# Patient Record
Sex: Male | Born: 1990 | Race: White | Hispanic: No | Marital: Single | Smoking: Current every day smoker
Health system: Southern US, Community
[De-identification: ages and names within clinical notes are randomized; demographics above are authoritative.]

## PROBLEM LIST (undated history)

## (undated) HISTORY — PX: OTHER SURGICAL HISTORY: SHX169

---
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
## 2009-07-15 ENCOUNTER — Emergency Department (HOSPITAL_COMMUNITY): Admission: EM | Admit: 2009-07-15 | Discharge: 2009-07-15 | Payer: Self-pay | Admitting: Emergency Medicine

## 2010-08-27 NOTE — Op Note (Signed)
Ryan Wang, Ryan Wang               ACCOUNT NO.:  [id number]   MEDICAL RECORD NO.:  [id number]          PATIENT TYPE:  REC   LOCATION:  [facility]                         FACILITY:  [facility]   PHYSICIAN:  Matthew A. Weingold, M.D.DATE OF BIRTH:  25-Mar-1991   DATE OF PROCEDURE:  10/26/2006  DATE OF DISCHARGE:                               OPERATIVE REPORT   PREOPERATIVE DIAGNOSIS:  Malunited right thumb metacarpal base fracture.   POSTOPERATIVE DIAGNOSIS:  Malunited right thumb metacarpal base  fracture.   PROCEDURE:  Takedown malunion with open reduction internal fixation  above using 0.045 K-wires x2.   SURGEON:  Artist Pais. Mina Marble, M.D.   ASSISTANT:  None.   ANESTHESIA:  General.   TOURNIQUET TIME:  38 minutes.   COMPLICATIONS:  None.   DRAINS:  None.   OPERATIVE REPORT:  The patient taken to operating suite, after induction  of adequate general anesthesia, right upper extremity prepped draped  usual sterile fashion.  Esmarch was used to exsanguinate limb.  Tourniquet inflated 250 mm. At this point time, gentle and forceful  manipulation was then tried to reduce a fracture of the thumb metacarpal  base with apex dorsal angulation.  After repeated attempts, the fracture  site was then opened longitudinally and dorsally.  The EPB tendons  retracted to the lateral side.  A subperiosteal dissection was  undertaken.  The fracture site was exposed.  A Freer elevator was used  to gently enter the fracture site which was taken down and debrided of  callus until reduction was performed.  It was then fixed with two 0.045  K-wires that were driven from the inside of the intramedullary canal  proximally out distally through the skin.  The fracture was then reduced  and then they are driven from distal to proximal across the fracture  site.  Intraoperative fluoroscopy revealed near anatomic reduction both  the AP lateral and oblique view.  K-wires were cut outside the skin and  bent upon  themselves.  Periosteum was closed with 4-0 Vicryl and the  skin with 4-0 nylon.  Xeroform, 4x4s fluffs and regular splint was  applied.  The patient tolerated well went to recovery stable fashion.      Artist Pais Mina Marble, M.D.  Electronically Signed     MAW/MEDQ  D:  10/26/2006  T:  10/27/2006  Job:  [id number]

## 2011-01-28 LAB — CBC
HCT: 49.6 — ABNORMAL HIGH
Hemoglobin: 17.1 — ABNORMAL HIGH
MCHC: 34.5
MCV: 90.1
Platelets: 155 — ABNORMAL LOW
RBC: 5.5
RDW: 13.2
WBC: 6.1

## 2012-02-25 ENCOUNTER — Encounter (HOSPITAL_COMMUNITY): Payer: Self-pay | Admitting: Emergency Medicine

## 2012-02-25 ENCOUNTER — Observation Stay (HOSPITAL_COMMUNITY)
Admission: EM | Admit: 2012-02-25 | Discharge: 2012-02-26 | Disposition: A | Discharge status: Home / Self Care | Payer: Self-pay | Attending: Emergency Medicine | Admitting: Emergency Medicine

## 2012-02-25 DIAGNOSIS — K130 Diseases of lips: Principal | ICD-10-CM | POA: Insufficient documentation

## 2012-02-25 DIAGNOSIS — L0390 Cellulitis, unspecified: Secondary | ICD-10-CM

## 2012-02-25 DIAGNOSIS — F17200 Nicotine dependence, unspecified, uncomplicated: Secondary | ICD-10-CM | POA: Insufficient documentation

## 2012-02-25 LAB — BASIC METABOLIC PANEL
BUN: 13 mg/dL (ref 6–23)
Chloride: 98 mEq/L (ref 96–112)
Glucose, Bld: 99 mg/dL (ref 70–99)
Potassium: 4 mEq/L (ref 3.5–5.1)
Sodium: 136 mEq/L (ref 135–145)

## 2012-02-25 LAB — CBC
MCH: 30.9 pg (ref 26.0–34.0)
MCHC: 35.5 g/dL (ref 30.0–36.0)
Platelets: 176 10*3/uL (ref 150–400)

## 2012-02-25 MED ORDER — ONDANSETRON HCL 4 MG/2ML IJ SOLN
4.0000 mg | INTRAMUSCULAR | Status: DC
  Administered 2012-02-25 – 2012-02-26 (×2): 4 mg via INTRAVENOUS
  Filled 2012-02-25: qty 2

## 2012-02-25 MED ORDER — CLINDAMYCIN PHOSPHATE 600 MG/50ML IV SOLN
600.0000 mg | Freq: Three times a day (TID) | INTRAVENOUS | Status: DC
  Administered 2012-02-25 – 2012-02-26 (×2): 600 mg via INTRAVENOUS
  Filled 2012-02-25 (×2): qty 50

## 2012-02-25 MED ORDER — MORPHINE SULFATE 4 MG/ML IJ SOLN
4.0000 mg | INTRAMUSCULAR | Status: DC | PRN
  Administered 2012-02-25 – 2012-02-26 (×3): 4 mg via INTRAVENOUS
  Filled 2012-02-25 (×3): qty 1

## 2012-02-25 MED ORDER — ONDANSETRON HCL 4 MG/2ML IJ SOLN
4.0000 mg | INTRAMUSCULAR | Status: DC
  Filled 2012-02-25: qty 2

## 2012-02-25 MED ORDER — SODIUM CHLORIDE 0.9 % IV SOLN
1000.0000 mL | Freq: Once | INTRAVENOUS | Status: AC
  Administered 2012-02-25: 1000 mL via INTRAVENOUS

## 2012-02-25 MED ORDER — KETOROLAC TROMETHAMINE 60 MG/2ML IM SOLN
60.0000 mg | Freq: Once | INTRAMUSCULAR | Status: AC
  Administered 2012-02-25: 60 mg via INTRAMUSCULAR
  Filled 2012-02-25: qty 2

## 2012-02-25 NOTE — ED Notes (Signed)
Patient reports that he had a pimple that he popped on his top lip yesterday; patient reports increased swelling to upper lip; reports pain in this area.  Patient reports another abscess area to left chest/side that has been there for about a week.

## 2012-02-25 NOTE — ED Provider Notes (Signed)
History     CSN: [id number]  Arrival date & time 02/25/12  2043   First MD Initiated Contact with Patient 02/25/12 2100      Chief Complaint  Patient presents with  . Oral Swelling    (Consider location/radiation/quality/duration/timing/severity/associated sxs/prior treatment) HPI Comments: Ryan Wang 21 y.o. male   The chief complaint is: Patient presents with:   Oral Swelling    Cc/ lip swelling. Popped small pimple on lip, 3 days ago.  Increased Heat, redness and swelling. Similar on left chest. Denies, diffult swallowing or breathing.  + teEth pain.    Denies fevers, chills, myalgias, arthralgias. Denies DOE, SOB, chest tightness or pressure, radiation to left arm, jaw or back, or diaphoresis. Denies dysuria, flank pain, suprapubic pain, frequency, urgency, or hematuria. Denies headaches, light headedness, weakness, visual disturbances. Denies abdominal pain, nausea, vomiting, diarrhea or constipation.      Patient is a 21 y.o. male presenting with abscess. The history is provided by the patient. No language interpreter was used.  Abscess  This is a new problem. The current episode started less than one week ago. The onset was gradual. The problem occurs continuously. The problem has been gradually worsening. The abscess is present on the lips. The problem is severe. The abscess is characterized by redness, peeling and painfulness. The abscess first occurred at home. Associated symptoms include decreased sleep. Pertinent negatives include no anorexia, no decrease in physical activity, not drinking less, no fever, no fussiness, not sleeping more, no congestion and no sore throat.    History reviewed. No pertinent past medical history.  Past Surgical History  Procedure Date  . Thumb surgery     History reviewed. No pertinent family history.  History  Substance Use Topics  . Smoking status: Current Every Day Smoker -- 1.0 packs/day  . Smokeless tobacco: Not  on file  . Alcohol Use: No      Review of Systems  Constitutional: Negative.  Negative for fever.  HENT: Negative for congestion and sore throat.        Lip pain  Eyes: Negative.   Respiratory: Negative.   Cardiovascular: Negative.   Gastrointestinal: Negative.  Negative for anorexia.  Genitourinary: Negative.   Musculoskeletal: Negative.   Neurological: Negative.   Hematological: Negative.   Psychiatric/Behavioral: Negative.   All other systems reviewed and are negative.    Allergies  Review of patient's allergies indicates no known allergies.  Home Medications  No current outpatient prescriptions on file.  BP 160/94  Pulse 75  Temp 99.2 F (37.3 C) (Oral)  Resp 16  SpO2 99%  Physical Exam  Nursing note and vitals reviewed. Constitutional: He appears well-developed and well-nourished. No distress.  HENT:  Head: Normocephalic and atraumatic.         Area of warmth, erythema, left upper lip is indurated.  There is a pustular head at the room the vermilion border.  A small amount of extension superior next of the nasolabial fold.  There is no fluctuance palpable.  No extension into the gingiva or teeth.  Eyes: Conjunctivae normal are normal. No scleral icterus.  Neck: Normal range of motion. Neck supple.  Cardiovascular: Normal rate, regular rhythm and normal heart sounds.   Pulmonary/Chest: Effort normal and breath sounds normal. No respiratory distress.  Abdominal: Soft. There is no tenderness.  Musculoskeletal: He exhibits no edema.  Neurological: He is alert.  Skin: Skin is warm and dry. He is not diaphoretic.  Psychiatric: His behavior is normal.  ED Course  Procedures (including critical care time)  Labs Reviewed - No data to display No results found.   1. Cellulitis and abscess       MDM  11:09 PM BP 160/94  Pulse 75  Temp 99.2 F (37.3 C) (Oral)  Resp 16  SpO2 99%  Patient t with left upper lip abscess and surrounding cellulitis.  I  spoken with Dr. Silverio Lay my attending physician as I do not feel comfortable draining this abscess.  Dr. Silverio Lay has suggested consult with oral maxillofacial facial surgery.  In placing the patient in CDU on cellulitis protocol.  Currently awaiting return call from [facility].    .11:17 PM I spoke with Dr. Barbette Merino who has agreed to see the patient in his office tomorrow.   5:17 AM Patient is doing well. He requests more toradol.  Patient redness appears improved.  He will get a second dose of clindamycin at 6:00 am. Patient may then be discharged if VSS and will f/u with Dr. Barbette Merino today. PO clinda at dc.    Arthor Captain, PA-C 02/26/12 2001

## 2012-02-25 NOTE — ED Notes (Signed)
Patient reports that he was given an antibiotic from his friend that had a staph infection; reports taking antibiotic once today.

## 2012-02-25 NOTE — ED Provider Notes (Signed)
Patient  to the CDU  11:30 PM.  he thinks the swelling in his lip has decreased since he began IV antibiotics.patient reports he is having less pain and is feeling better. Patient reports area began as a pimple yesterday and now has swelling to his upper lip. On physical exam vitals are stable patient is resting comfortably. There is erythema to patient's left upper lip there is a blistered area in the center of patient's mid lip. Plan is for patient to continue IV antibiotics.   Lonia Skinner [place], [place] 02/25/12 2344

## 2012-02-26 MED ORDER — KETOROLAC TROMETHAMINE 30 MG/ML IJ SOLN
30.0000 mg | Freq: Once | INTRAMUSCULAR | Status: AC
  Administered 2012-02-26: 30 mg via INTRAVENOUS
  Filled 2012-02-26: qty 1

## 2012-02-26 MED ORDER — CLINDAMYCIN HCL 150 MG PO CAPS: 300.0000 mg | ORAL_CAPSULE | Freq: Four times a day (QID) | ORAL | Status: DC

## 2012-02-26 NOTE — ED Provider Notes (Signed)
Medical screening examination/treatment/procedure(s) were performed by non-physician practitioner and as supervising physician I was immediately available for consultation/collaboration.   Richardean Canal, MD 02/26/12 [phone]

## 2012-02-26 NOTE — ED Provider Notes (Signed)
Medical screening examination/treatment/procedure(s) were performed by non-physician practitioner and as supervising physician I was immediately available for consultation/collaboration.   Richardean Canal, MD 02/26/12 [phone]

## 2012-02-26 NOTE — ED Notes (Signed)
EDP at bedside  

## 2012-02-26 NOTE — ED Notes (Signed)
Pt denies nausea at this time, states does not need zofran. Zofran held.

## 2013-05-04 ENCOUNTER — Emergency Department (HOSPITAL_COMMUNITY)
Admission: EM | Admit: 2013-05-04 | Discharge: 2013-05-04 | Disposition: A | Discharge status: Home / Self Care | Payer: 59 | Attending: Emergency Medicine | Admitting: Emergency Medicine

## 2013-05-04 ENCOUNTER — Encounter (HOSPITAL_COMMUNITY): Payer: Self-pay | Admitting: Emergency Medicine

## 2013-05-04 ENCOUNTER — Emergency Department (HOSPITAL_COMMUNITY): Payer: 59

## 2013-05-04 DIAGNOSIS — R05 Cough: Secondary | ICD-10-CM | POA: Insufficient documentation

## 2013-05-04 DIAGNOSIS — R042 Hemoptysis: Secondary | ICD-10-CM | POA: Insufficient documentation

## 2013-05-04 DIAGNOSIS — F17200 Nicotine dependence, unspecified, uncomplicated: Secondary | ICD-10-CM | POA: Insufficient documentation

## 2013-05-04 DIAGNOSIS — R0789 Other chest pain: Secondary | ICD-10-CM | POA: Insufficient documentation

## 2013-05-04 DIAGNOSIS — R059 Cough, unspecified: Secondary | ICD-10-CM | POA: Insufficient documentation

## 2013-05-04 LAB — BASIC METABOLIC PANEL
BUN: 15 mg/dL (ref 6–23)
CALCIUM: 9.2 mg/dL (ref 8.4–10.5)
CHLORIDE: 101 meq/L (ref 96–112)
CO2: 23 meq/L (ref 19–32)
Creatinine, Ser: 0.96 mg/dL (ref 0.50–1.35)
GFR calc Af Amer: >90 mL/min (ref >90)
GFR calc non Af Amer: >90 mL/min (ref >90)
GLUCOSE: 96 mg/dL (ref 70–99)
Potassium: 4 mEq/L (ref 3.7–5.3)
Sodium: 140 mEq/L (ref 137–147)

## 2013-05-04 LAB — POCT I-STAT TROPONIN I
TROPONIN I, POC: 0 ng/mL (ref 0.00–0.08)
Troponin i, poc: 0 ng/mL (ref 0.00–0.08)

## 2013-05-04 LAB — CBC
HEMATOCRIT: 49.6 % (ref 39.0–52.0)
HEMOGLOBIN: 17.5 g/dL — AB (ref 13.0–17.0)
MCH: 30.8 pg (ref 26.0–34.0)
MCHC: 35.3 g/dL (ref 30.0–36.0)
MCV: 87.3 fL (ref 78.0–100.0)
Platelets: 208 10*3/uL (ref 150–400)
RBC: 5.68 MIL/uL (ref 4.22–5.81)
RDW: 12.6 % (ref 11.5–15.5)
WBC: 10 10*3/uL (ref 4.0–10.5)

## 2013-05-04 LAB — D-DIMER, QUANTITATIVE (NOT AT ARMC): D DIMER QUANT: 0.34 ug{FEU}/mL (ref 0.00–0.48)

## 2013-05-04 LAB — PRO B NATRIURETIC PEPTIDE: Pro B Natriuretic peptide (BNP): <5 pg/mL (ref 0–125)

## 2013-05-04 MED ORDER — KETOROLAC TROMETHAMINE 30 MG/ML IJ SOLN: 30.0000 mg | Freq: Once | INTRAMUSCULAR | Status: DC

## 2013-05-04 MED ORDER — KETOROLAC TROMETHAMINE 60 MG/2ML IM SOLN
60.0000 mg | Freq: Once | INTRAMUSCULAR | Status: AC
  Administered 2013-05-04: 60 mg via INTRAMUSCULAR
  Filled 2013-05-04: qty 2

## 2013-05-04 NOTE — ED Notes (Signed)
Pt. reports left chest pain for several days with slight SOB worse with deep inspiration , denies nausea or diaphoresis .

## 2013-05-04 NOTE — Discharge Instructions (Signed)
If you were given medicines take as directed.  If you are on coumadin or contraceptives realize their levels and effectiveness is altered by many different medicines.  If you have any reaction (rash, tongues swelling, other) to the medicines stop taking and see a physician.   Please follow up as directed and return to the ER or see a physician for new or worsening symptoms.  Thank you.  Chest Pain (Nonspecific) Chest pain has many causes. Your pain could be caused by something serious, such as a heart attack or a blood clot in the lungs. It could also be caused by something less serious, such as a chest bruise or a virus. Follow up with your doctor. More lab tests or other studies may be needed to find the cause of your pain. Most of the time, nonspecific chest pain will improve within 2 to 3 days of rest and mild pain medicine. HOME CARE  For chest bruises, you may put ice on the sore area for 15-20 minutes, 03-04 times a day. Do this only if it makes you feel better.  Put ice in a plastic bag.  Place a towel between the skin and the bag.  Rest for the next 2 to 3 days.  Go back to work if the pain improves.  See your doctor if the pain lasts longer than 1 to 2 weeks.  Only take medicine as told by your doctor.  Quit smoking if you smoke. GET HELP RIGHT AWAY IF:   There is more pain or pain that spreads to the arm, neck, jaw, back, or belly (abdomen).  You have shortness of breath.  You cough more than usual or cough up blood.  You have very bad back or belly pain, feel sick to your stomach (nauseous), or throw up (vomit).  You have very bad weakness.  You pass out (faint).  You have a fever. Any of these problems may be serious and may be an emergency. Do not wait to see if the problems will go away. Get medical help right away. Call your local emergency services 911 in [place] Do not drive yourself to the hospital. MAKE SURE YOU:   Understand these instructions.  Will watch  this condition.  Will get help right away if you or your child is not doing well or gets worse. Document Released: 09/17/2007 Document Revised: 06/23/2011 Document Reviewed: 09/17/2007 [facility] Patient Information 2014 [place], [place].

## 2013-05-04 NOTE — ED Provider Notes (Signed)
CSN: [id number]     Arrival date & time 05/04/13  0010 History   First MD Initiated Contact with Patient 05/04/13 0126     Chief Complaint  Patient presents with  . Chest Pain   (Consider location/radiation/quality/duration/timing/severity/associated sxs/prior Treatment) HPI Comments: 23 yo male with smoking hx, no cardiac or PE hx or risk factors except smoking presents with pleuritic chest pain, mild cough, intermittent for 3-4 days.  Pt has had this in the past.  Pain at end of deep breath and with pressing on anterior chest. No injuries or new workouts.  .Patient denies blood clot history, active cancer, recent major trauma or surgery, unilateral leg swelling/ pain, recent long travel.  Pt did have mild hemoptysis.    Patient is a 23 y.o. male presenting with chest pain. The history is provided by the patient.  Chest Pain Associated symptoms: no abdominal pain, no back pain, no fever, no headache, no shortness of breath and not vomiting     History reviewed. No pertinent past medical history. Past Surgical History  Procedure Laterality Date  . Thumb surgery     No family history on file. History  Substance Use Topics  . Smoking status: Current Every Day Smoker -- 1.00 packs/day  . Smokeless tobacco: Not on file  . Alcohol Use: No    Review of Systems  Constitutional: Negative for fever and chills.  HENT: Negative for congestion.   Eyes: Negative for visual disturbance.  Respiratory: Negative for shortness of breath.   Cardiovascular: Positive for chest pain.  Gastrointestinal: Negative for vomiting and abdominal pain.  Genitourinary: Negative for dysuria and flank pain.  Musculoskeletal: Negative for back pain, neck pain and neck stiffness.  Skin: Negative for rash.  Neurological: Negative for light-headedness and headaches.    Allergies  Review of patient's allergies indicates no known allergies.  Home Medications  No current outpatient prescriptions on file. BP  125/77  Pulse 92  Temp(Src) 98.6 F (37 C) (Oral)  Resp 23  SpO2 100% Physical Exam  Nursing note and vitals reviewed. Constitutional: He is oriented to person, place, and time. He appears well-developed and well-nourished.  HENT:  Head: Normocephalic and atraumatic.  Eyes: Conjunctivae are normal. Right eye exhibits no discharge. Left eye exhibits no discharge.  Neck: Normal range of motion. Neck supple. No tracheal deviation present.  Cardiovascular: Normal rate, regular rhythm and intact distal pulses.   Pulmonary/Chest: Effort normal and breath sounds normal.  Abdominal: Soft. He exhibits no distension. There is no tenderness. There is no guarding.  Musculoskeletal: He exhibits tenderness (anterior chest parasternal). He exhibits no edema.  Neurological: He is alert and oriented to person, place, and time.  Skin: Skin is warm. No rash noted.  Psychiatric: He has a normal mood and affect.    ED Course  Procedures (including critical care time) Labs Review Labs Reviewed  CBC - Abnormal; Notable for the following:    Hemoglobin 17.5 (*)    All other components within normal limits  BASIC METABOLIC PANEL  PRO B NATRIURETIC PEPTIDE  D-DIMER, QUANTITATIVE  POCT I-STAT TROPONIN I  POCT I-STAT TROPONIN I   Imaging Review Dg Chest 2 View  05/04/2013   CLINICAL DATA:  Left-sided chest pain on taking deep breaths.  EXAM: CHEST  2 VIEW  COMPARISON:  Chest radiograph performed 07/31/2006  FINDINGS: The lungs are well-aerated and clear. There is no evidence of focal opacification, pleural effusion or pneumothorax.  The heart is normal in size; the mediastinal  contour is within normal limits. No acute osseous abnormalities are seen.  IMPRESSION: No acute cardiopulmonary process seen.   Electronically Signed   By: Roanna Raider M.D.   On: 05/04/2013 01:04    EKG Interpretation    Date/Time:  Wednesday May 04 2013 00:25:56 EST Ventricular Rate:  85 PR Interval:  142 QRS  Duration: 96 QT Interval:  344 QTC Calculation: 409 R Axis:   97 Text Interpretation:  Normal sinus rhythm Rightward axis Borderline ECG poor baseline, similar to previous Confirmed by Islam Eichinger  MD, Edker Punt ([id number]) on 05/04/2013 1:26:53 AM            MDM   1. Chest pain, atypical    Well appearing.  Very low risk CAD and PE, atypical story. CXR reviewed, no acute findings. With pleuritic pain and hemoptysis ddimer ordered, neg, no indication for CT at this time. Toradol given, pain improved. Fup outpt discussed.  Results and differential diagnosis were discussed with the patient. Close follow up outpatient was discussed, patient comfortable with the plan.       Enid Skeens, MD 05/04/13 [phone]

## 2015-05-31 ENCOUNTER — Emergency Department
Admission: EM | Admit: 2015-05-31 | Discharge: 2015-05-31 | Discharge status: Against Medical Advice | Payer: Self-pay | Attending: Emergency Medicine | Admitting: Emergency Medicine

## 2015-05-31 ENCOUNTER — Encounter: Payer: Self-pay | Admitting: Emergency Medicine

## 2015-05-31 DIAGNOSIS — F17200 Nicotine dependence, unspecified, uncomplicated: Secondary | ICD-10-CM | POA: Insufficient documentation

## 2015-05-31 DIAGNOSIS — T401X4A Poisoning by heroin, undetermined, initial encounter: Secondary | ICD-10-CM | POA: Insufficient documentation

## 2015-05-31 DIAGNOSIS — Y998 Other external cause status: Secondary | ICD-10-CM | POA: Insufficient documentation

## 2015-05-31 DIAGNOSIS — Y9389 Activity, other specified: Secondary | ICD-10-CM | POA: Insufficient documentation

## 2015-05-31 DIAGNOSIS — Z5321 Procedure and treatment not carried out due to patient leaving prior to being seen by health care provider: Secondary | ICD-10-CM | POA: Insufficient documentation

## 2015-05-31 DIAGNOSIS — L03113 Cellulitis of right upper limb: Secondary | ICD-10-CM | POA: Insufficient documentation

## 2015-05-31 DIAGNOSIS — Y9289 Other specified places as the place of occurrence of the external cause: Secondary | ICD-10-CM | POA: Insufficient documentation

## 2015-05-31 MED ORDER — SULFAMETHOXAZOLE-TRIMETHOPRIM 800-160 MG PO TABS: 2.0000 | ORAL_TABLET | Freq: Two times a day (BID) | ORAL | Status: AC

## 2015-05-31 MED ORDER — SULFAMETHOXAZOLE-TRIMETHOPRIM 800-160 MG PO TABS
2.0000 | ORAL_TABLET | Freq: Once | ORAL | Status: AC
  Administered 2015-05-31: 2 via ORAL
  Filled 2015-05-31: qty 2

## 2015-05-31 NOTE — ED Notes (Signed)

## 2015-05-31 NOTE — ED Notes (Signed)
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 Per EMS pt was apneic upon their arrival, pt given 1.5 mg of Narcan by [facility] and 1 mg Narcan by EMS. Pt reports last time use was 4 months ago, pt denies SI or HI. Denies shortness of breath, chest pain, or abdominal pain. No increased work in breathing noted, pt alert and oriented, skin warm and dry.

## 2015-05-31 NOTE — Discharge Instructions (Signed)
Drug Overdose °Drug overdose happens when you take too much of a drug. An overdose can occur with illegal drugs, prescription drugs, or over-the-counter (OTC) drugs. °The effects of drug overdose can be mild, dangerous, or even deadly. °CAUSES °Drug overdose may be caused by: °· Taking too much of a drug on purpose. °· Taking too much of a drug by accident. °· An error made by a health care provider who prescribes a drug. °· An error made by a pharmacist who fills the prescription order. °Drugs that commonly cause overdose include: °· Mental health drugs. °· Pain medicines. °· Illegal drugs. °· OTC cough and cold medicines. °· Heart medicines. °· Seizure medicines. °RISK FACTORS °Drug overdose is more likely in: °· Children. They may be attracted to colorful pills. Because of children's small size, even a small amount of a drug can be dangerous. °· Elderly people. They may be taking many different drugs. Elderly people may have difficulty reading labels or remembering when they last took their medicine. °The risk of drug overdose is also higher for someone who: °· Takes illegal drugs. °· Takes a drug and drinks alcohol. °· Has a mental health condition. °SYMPTOMS °Signs and symptoms of drug overdose depend on the drug and the amount that was taken. Common danger signs include: °· Behavior changes. °· Sleepiness. °· Slowed breathing. °· Nausea and vomiting. °· Seizures. °· Changes in eye pupil size (very large or very small). °If there are signs of very low blood pressure from a drug overdose (shock), emergency treatment is required. These signs include: °· Cold and clammy skin. °· Pale skin. °· Blue lips. °· Very slow breathing. °· Extreme sleepiness. °· Loss of consciousness. °DIAGNOSIS °Drug overdose may be diagnosed based on your symptoms. It is important that you tell your health care provider: °· All of the drugs that you have taken. °· When you took the drugs. °· Whether you were drinking alcohol. °Your health  care provider will do a physical exam. This exam may include: °· Checking and monitoring your heart rate and rhythm, your temperature, and your blood pressure (vital signs). °· Checking your breathing and oxygen level. °You may also have tests, including:  °· Urine tests to check for drugs in your system. °· Blood tests to check for: °¨ Drugs in your system. °¨ Signs of an imbalance of your blood minerals (electrolytes). °¨ Liver damage. °¨ Kidney damage. °TREATMENT °Supporting your vital signs and your breathing is the first step in treating a drug overdose. Treatment may also include: °· Receiving fluids and electrolytes through an IV tube. °· Having a breathing tube (endotracheal tube) inserted in your airway to help you breathe. °· Having a tube passed through your nose and into your stomach (nasogastric tube) to wash out your stomach. °· Medicines. You may get medicines to: °¨ Make you vomit. °¨ Absorb any medicine that is left in your digestive system (activated charcoal). °¨ Block or reverse the effect of the drug that caused the overdose. °· Having your blood filtered through an artificial kidney machine (hemodialysis). You may need this if your overdose is severe or if you have kidney failure. °· Having ongoing counseling and mental health support if you intentionally overdosed or used an illegal drug. °HOME CARE INSTRUCTIONS °· Take medicines only as directed by your health care provider. Always ask your health care provider to discuss the possible side effects of any new drug that you start taking. °· Keep a list of all of the drugs   that you take, including over-the-counter medicines. Bring this list with you to all of your medical visits.  Read the drug inserts that come with your medicines.  Do not use illegal drugs.  Do not drink alcohol when taking drugs.  Store all medicines in safety containers that are out of the reach of children.  Keep the phone number of your local poison control  center near your phone or on your cell phone.  Get help if you are struggling with alcohol or drug use.  Get help if you are struggling with depression or another mental health problem.  Keep all follow-up visits as directed by your health care provider. This is important. SEEK MEDICAL CARE IF:  Your symptoms return.  You develop any new signs or symptoms when you are taking medicines. SEEK IMMEDIATE MEDICAL CARE IF:  You think that you or someone else may have taken too much of a drug. The hotline of the [facility] is [phone].  You or someone else is having symptoms of a drug overdose.  You have serious thoughts about hurting yourself or others.  You have chest pain.  You have difficulty breathing.  You have a loss of consciousness. Drug overdose is an emergency. Do not wait to see if the symptoms will go away. Get medical help right away. Call your local emergency services (911 in the [place] Do not drive yourself to the hospital.   This information is not intended to replace advice given to you by your health care provider. Make sure you discuss any questions you have with your health care provider.   Document Released: 08/15/2014 Document Reviewed: 08/15/2014 [employer] Interactive Patient Education [employer]  Narcotic Overdose A narcotic overdose is the misuse or overuse of a narcotic drug. A narcotic overdose can make you pass out and stop breathing. If you are not treated right away, this can cause permanent brain damage or stop your heart. Medicine may be given to reverse the effects of an overdose. If so, this medicine may bring on withdrawal symptoms. The symptoms may be abdominal cramps, throwing up (vomiting), sweating, chills, and nervousness. Injecting narcotics can cause more problems than just an overdose. AIDS, hepatitis, and other very serious infections are transmitted by sharing needles and syringes. If you decide to quit using,  there are medicines which can help you through the withdrawal period. Trying to quit all at once on your own can be uncomfortable, but not life-threatening. Call your caregiver, Narcotics Anonymous, or any drug and alcohol treatment program for further help.    This information is not intended to replace advice given to you by your health care provider. Make sure you discuss any questions you have with your health care provider.   Document Released: 05/08/2004 Document Revised: 04/21/2014 Document Reviewed: 09/20/2014 Elsevier Interactive Patient Education 2016 [employer]  Cellulitis Cellulitis is an infection of the skin and the tissue beneath it. The infected area is usually red and tender. Cellulitis occurs most often in the arms and lower legs.  CAUSES  Cellulitis is caused by bacteria that enter the skin through cracks or cuts in the skin. The most common types of bacteria that cause cellulitis are staphylococci and streptococci. SIGNS AND SYMPTOMS   Redness and warmth.  Swelling.  Tenderness or pain.  Fever. DIAGNOSIS  Your health care provider can usually determine what is wrong based on a physical exam. Blood tests may also be done. TREATMENT  Treatment usually involves taking an  antibiotic medicine. HOME CARE INSTRUCTIONS   Take your antibiotic medicine as directed by your health care provider. Finish the antibiotic even if you start to feel better.  Keep the infected arm or leg elevated to reduce swelling.  Apply a warm cloth to the affected area up to 4 times per day to relieve pain.  Take medicines only as directed by your health care provider.  Keep all follow-up visits as directed by your health care provider. SEEK MEDICAL CARE IF:   You notice red streaks coming from the infected area.  Your red area gets larger or turns dark in color.  Your bone or joint underneath the infected area becomes painful after the skin has healed.  Your infection returns in  the same area or another area.  You notice a swollen bump in the infected area.  You develop new symptoms.  You have a fever. SEEK IMMEDIATE MEDICAL CARE IF:   You feel very sleepy.  You develop vomiting or diarrhea.  You have a general ill feeling (malaise) with muscle aches and pains.   This information is not intended to replace advice given to you by your health care provider. Make sure you discuss any questions you have with your health care provider.   Document Released: 01/08/2005 Document Revised: 12/20/2014 Document Reviewed: 06/16/2011 Elsevier Interactive Patient Education [employer]

## 2015-05-31 NOTE — ED Provider Notes (Addendum)
[facility] Emergency Department Provider Note  ____________________________________________  Time seen: Approximately 10:25 PM  I have reviewed the triage vital signs and the nursing notes.   HISTORY  Chief Complaint Drug Overdose    HPI Ryan Wang is a 25 y.o. male with a history of substance abuse who is presenting tonight via EMS after a heroin overdose. He admits to using heroin earlier tonight and injecting in his right antecubital fossa. He says that he hasn't used in months and has relapsed and thinks this is why he overdosed.  He suspects he has a lower tolerance at this time. He denies using any alcohol or other drugs tonight. Per EMS, he was not breathing when fire rescue arrived. He was initially given 1.5 mg of Narcan and then an additional 1 mg. After the total of 2.5 mg he became lucid and at her baseline mental status. The patient has no complaints at this time. He says that he would like to go home.   No past medical history on file.  There are no active problems to display for this patient.   Past Surgical History  Procedure Laterality Date  . Thumb surgery      No current outpatient prescriptions on file.  Allergies Review of patient's allergies indicates no known allergies.  No family history on file.  Social History Social History  Substance Use Topics  . Smoking status: Current Every Day Smoker -- 1.00 packs/day  . Smokeless tobacco: Not on file  . Alcohol Use: No    Review of Systems Constitutional: No fever/chills Eyes: No visual changes. ENT: No sore throat. Cardiovascular: Denies chest pain. Respiratory: Denies shortness of breath. Gastrointestinal: No abdominal pain.  No nausea, no vomiting.  No diarrhea.  No constipation. Genitourinary: Negative for dysuria. Musculoskeletal: Negative for back pain. Skin: Negative for rash. Neurological: Negative for headaches, focal weakness or numbness.  10-point ROS  otherwise negative.  ____________________________________________   PHYSICAL EXAM:  VITAL SIGNS: ED Triage Vitals  Enc Vitals Group     BP --      Pulse --      Resp --      Temp --      Temp src --      SpO2 05/31/15 2230 100 %     Weight --      Height --      Head Cir --      Peak Flow --      Pain Score --      Pain Loc --      Pain Edu? --      Excl. in GC? --     Constitutional: Alert and oriented. Well appearing and in no acute distress. Eyes: Conjunctivae are normal. PERRL. EOMI. Head: Atraumatic. Nose: No congestion/rhinnorhea. Mouth/Throat: Mucous membranes are moist.  Oropharynx non-erythematous. Neck: No stridor.   Cardiovascular: Normal rate, regular rhythm. Grossly normal heart sounds.  Good peripheral circulation. Respiratory: Normal respiratory effort.  No retractions. Lungs CTAB. Gastrointestinal: Soft and nontender. No distention. No abdominal bruits. No CVA tenderness. Musculoskeletal: No lower extremity tenderness nor edema.  No joint effusions. Neurologic:  Normal speech and language. No gross focal neurologic deficits are appreciated. No gait instability. Clinically sober. Skin:  Skin is warm, dry and intact. Right proximal forearm with 1 cm raised lesion without any fluctuance. It is erythematous with mild tenderness to palpation. The patient says that this is where he injected. Psychiatric: Mood and affect are normal. Speech and  behavior are normal.  ____________________________________________   LABS (all labs ordered are listed, but only abnormal results are displayed)  Labs Reviewed - No data to display ____________________________________________  EKG   ____________________________________________  RADIOLOGY   ____________________________________________   PROCEDURES   ____________________________________________   INITIAL IMPRESSION / ASSESSMENT AND PLAN / ED COURSE  Pertinent labs & imaging results that were available  during my care of the patient were reviewed by me and considered in my medical decision making (see chart for details).  ----------------------------------------- 10:39 PM on 05/31/2015 -----------------------------------------  Patient is awake and alert at this time. He is insistent on leaving the emergency department. I advised him that whenever anybody injects drugs it is unclear exactly what they may be injecting in that it may last for very long period of time. Isolated that the Narcan which was bandaged used to wake him up may only last 45 minutes to an hour. I said that to be safe we would need to watch him in the emergency department for likely up to 4 hours. I described to him that he may slip back into a coma and stop breathing and die or become permanently disabled from brain damage. Despite this he says he would still like to go home because he said he does not have time to stay in emergency department.  His girlfriend is with him who also used heroin tonight but did not receive any Narcan. She is awake and alert and reasonable this time and says that she will watch him. She was instructed to immediately call 911 if she sees him slipping back into unconsciousness or becoming drowsy. They both understand the plan and are willing to comply. The patient has full decisional capacity and insight into his illness and consequences of signing out AGAINST MEDICAL ADVICE. He'll be given antibiotics for his right antecubital lesion. It appears that it may be becoming infected. ____________________________________________   FINAL CLINICAL IMPRESSION(S) / ED DIAGNOSES  Cellulitis. Heroin overdose. AGAINST MEDICAL ADVICE discharge.    Myrna Blazer, MD 05/31/15 2242  Patient is aware that he may return to the emergency department at any time to continue his care.  Myrna Blazer, MD 05/31/15 2245  Patient without any suicidal intent. He says he uses recreationally.  Myrna Blazer, MD 05/31/15 [phone]

## 2015-12-14 DEATH — deceased
